# Patient Record
Sex: Male | Born: 1965 | Race: Black or African American | Hispanic: No | Marital: Single | Smoking: Current every day smoker
Health system: Southern US, Community
[De-identification: ages and names within clinical notes are randomized; demographics above are authoritative.]

---

## 2007-12-08 ENCOUNTER — Emergency Department: Payer: Self-pay | Admitting: Unknown Physician Specialty

## 2007-12-10 ENCOUNTER — Emergency Department: Payer: Self-pay | Admitting: Internal Medicine

## 2014-02-03 ENCOUNTER — Emergency Department: Payer: Self-pay | Admitting: Emergency Medicine

## 2014-02-03 LAB — LIPASE, BLOOD: LIPASE: 114 U/L (ref 73–393)

## 2014-02-03 LAB — CBC WITH DIFFERENTIAL/PLATELET
Basophil #: 0.1 10*3/uL (ref 0.0–0.1)
Basophil %: 1.3 %
EOS ABS: 0.1 10*3/uL (ref 0.0–0.7)
EOS PCT: 2.3 %
HCT: 43.1 % (ref 40.0–52.0)
HGB: 13.7 g/dL (ref 13.0–18.0)
LYMPHS ABS: 1.8 10*3/uL (ref 1.0–3.6)
Lymphocyte %: 34.5 %
MCH: 27.2 pg (ref 26.0–34.0)
MCHC: 31.9 g/dL — ABNORMAL LOW (ref 32.0–36.0)
MCV: 85 fL (ref 80–100)
Monocyte #: 0.4 x10 3/mm (ref 0.2–1.0)
Monocyte %: 7.4 %
NEUTROS PCT: 54.5 %
Neutrophil #: 2.8 10*3/uL (ref 1.4–6.5)
Platelet: 205 10*3/uL (ref 150–440)
RBC: 5.05 10*6/uL (ref 4.40–5.90)
RDW: 14.8 % — ABNORMAL HIGH (ref 11.5–14.5)
WBC: 5.1 10*3/uL (ref 3.8–10.6)

## 2014-02-03 LAB — COMPREHENSIVE METABOLIC PANEL
ALBUMIN: 3.2 g/dL — AB (ref 3.4–5.0)
ALT: 16 U/L
AST: 19 U/L (ref 15–37)
Alkaline Phosphatase: 68 U/L
Anion Gap: 5 — ABNORMAL LOW (ref 7–16)
BUN: 6 mg/dL — ABNORMAL LOW (ref 7–18)
Bilirubin,Total: 0.3 mg/dL (ref 0.2–1.0)
CHLORIDE: 106 mmol/L (ref 98–107)
CO2: 31 mmol/L (ref 21–32)
CREATININE: 1.24 mg/dL (ref 0.60–1.30)
Calcium, Total: 8.5 mg/dL (ref 8.5–10.1)
EGFR (African American): >60
EGFR (Non-African Amer.): >60
Glucose: 101 mg/dL — ABNORMAL HIGH (ref 65–99)
OSMOLALITY: 281 (ref 275–301)
Potassium: 3.7 mmol/L (ref 3.5–5.1)
Sodium: 142 mmol/L (ref 136–145)
TOTAL PROTEIN: 7 g/dL (ref 6.4–8.2)

## 2014-02-04 LAB — URINALYSIS, COMPLETE
BACTERIA: NONE SEEN
Bilirubin,UR: NEGATIVE
Blood: NEGATIVE
GLUCOSE, UR: NEGATIVE mg/dL (ref 0–75)
Ketone: NEGATIVE
Leukocyte Esterase: NEGATIVE
Nitrite: NEGATIVE
Ph: 6 (ref 4.5–8.0)
Protein: NEGATIVE
RBC, UR: NONE SEEN /HPF (ref 0–5)
SPECIFIC GRAVITY: 1.012 (ref 1.003–1.030)
SQUAMOUS EPITHELIAL: NONE SEEN
WBC UR: NONE SEEN /HPF (ref 0–5)

## 2014-10-11 ENCOUNTER — Encounter: Payer: Self-pay | Admitting: Emergency Medicine

## 2014-10-11 ENCOUNTER — Emergency Department
Admission: EM | Admit: 2014-10-11 | Discharge: 2014-10-11 | Disposition: A | Discharge status: Home / Self Care | Payer: Self-pay | Attending: Student | Admitting: Student

## 2014-10-11 ENCOUNTER — Emergency Department: Payer: Self-pay

## 2014-10-11 DIAGNOSIS — M4686 Other specified inflammatory spondylopathies, lumbar region: Secondary | ICD-10-CM | POA: Insufficient documentation

## 2014-10-11 DIAGNOSIS — K4090 Unilateral inguinal hernia, without obstruction or gangrene, not specified as recurrent: Secondary | ICD-10-CM

## 2014-10-11 DIAGNOSIS — M19012 Primary osteoarthritis, left shoulder: Secondary | ICD-10-CM

## 2014-10-11 DIAGNOSIS — Z720 Tobacco use: Secondary | ICD-10-CM | POA: Insufficient documentation

## 2014-10-11 DIAGNOSIS — M47896 Other spondylosis, lumbar region: Secondary | ICD-10-CM

## 2014-10-11 MED ORDER — MELOXICAM 15 MG PO TABS: 15.0000 mg | ORAL_TABLET | Freq: Every day | ORAL | Status: AC

## 2014-10-11 NOTE — ED Notes (Signed)
Pt reports that he has had left shoulder, back and groin pain. Pt is able to ambulate without difficulty.

## 2014-10-11 NOTE — ED Provider Notes (Signed)
[facility] Emergency Department Provider Note  ____________________________________________  Time seen: Approximately 9:07 AM  I have reviewed the triage vital signs and the nursing notes.   HISTORY  Chief Complaint Shoulder Pain; Back Pain; and Groin Pain    HPI Robert Boone is a 49 y.o. male complaining of left shoulder low back and groin pain. Patient stated that the shoulder back is a chronic condition last more than 2 years. Patient's date recent onset approximately 2 weeks of left groin pain was  with swelling. Patient stated the groin pain increases with heavy lifting which is required by his jaw. Patient rates his left shoulder and back pain as a 5/10 rates his left groin pain as a 7/10. Patient denies any bladder or bowel dysfunction. Patient has been taking over-the-counter anti-inflammatory is with no relief.   History reviewed. No pertinent past medical history.  There are no active problems to display for this patient.   History reviewed. No pertinent past surgical history.  No current outpatient prescriptions on file.  Allergies Review of patient's allergies indicates no known allergies.  History reviewed. No pertinent family history.  Social History History  Substance Use Topics  . Smoking status: Current Every Day Smoker  . Smokeless tobacco: Not on file  . Alcohol Use: No    Review of Systems Constitutional: No fever/chills Eyes: No visual changes. ENT: No sore throat. Cardiovascular: Denies chest pain. Respiratory: Denies shortness of breath. Gastrointestinal: No abdominal pain.  No nausea, no vomiting.  No diarrhea.  No constipation. Genitourinary: Negative for dysuria.Left groin pain  Musculoskeletal:Left shoulder and lower back pain. Skin: Negative for rash. Neurological: Negative for headaches, focal weakness or numbness. 10-point ROS otherwise  negative.  ____________________________________________   PHYSICAL EXAM:  VITAL SIGNS: ED Triage Vitals  Enc Vitals Group     BP 10/11/14 0849 142/84 mmHg     Pulse Rate 10/11/14 0849 60     Resp 10/11/14 0849 18     Temp 10/11/14 0849 98.3 F (36.8 C)     Temp Source 10/11/14 0849 Oral     SpO2 10/11/14 0849 100 %     Weight 10/11/14 0849 165 lb (74.844 kg)     Height 10/11/14 0849 6\' 1"  (1.854 m)     Head Cir --      Peak Flow --      Pain Score 10/11/14 0849 7     Pain Loc --      Pain Edu? --      Excl. in GC? --     Constitutional: Alert and oriented. Well appearing and in no acute distress. Eyes: Conjunctivae are normal. PERRL. EOMI. Head: Atraumatic. Nose: No congestion/rhinnorhea. Mouth/Throat: Mucous membranes are moist.  Oropharynx non-erythematous. Neck: No stridor. No cervical spine tenderness to palpation Hematological/Lymphatic/Immunilogical: No cervical lymphadenopathy. Cardiovascular: Normal rate, regular rhythm. Grossly normal heart sounds.  Good peripheral circulation. Respiratory: Normal respiratory effort.  No retractions. Lungs CTAB. Gastrointestinal: Soft and nontender. No distention. No abdominal bruits. No CVA tenderness. Genitourinary:  **}Musculoskeletal: No lower extremity tenderness nor edema.  No joint effusions. Neurologic:  Normal speech and language. No gross focal neurologic deficits are appreciated. Speech is normal. No gait instability. Skin:  Skin is warm, dry and intact. No rash noted. Psychiatric: Mood and affect are normal. Speech and behavior are normal.  ____________________________________________   LABS (all labs ordered are listed, but only abnormal results are displayed)  Labs Reviewed - No data to display ____________________________________________  EKG  ____________________________________________  RADIOLOGY     iNo acute findings for left shoulder pain mild arthritic changes in the L-spine. I, Joni Reining,  personally viewed and evaluated these images as part of my medical decision making.   ____________________________________________   PROCEDURES  Procedure(s) performed: None  Critical Care performed: No  ____________________________________________   INITIAL IMPRESSION / ASSESSMENT AND PLAN / ED COURSE  Pertinent labs & imaging results that were available during my care of the patient were reviewed by me and considered in my medical decision making (see chart for details).  Mild degenerative changes of the left shoulder and low back. Patient has a mild left inguinal hernia which was easily reducible. Patient will be given a consult the surgical clinic for further evaluation and treatment. Patient given a prescription for morbid and a work restriction until evaluation by the surgical clinic. ____________________________________________   FINAL CLINICAL IMPRESSION(S) / ED DIAGNOSES  Final diagnoses:  Left inguinal hernia  Other osteoarthritis of spine, lumbar region  Primary osteoarthritis of left shoulder      Joni Reining, PA-C 10/11/14 1120  Gayla Doss, MD 10/11/14 1529

## 2015-07-09 IMAGING — CT CT ABD-PELV W/ CM
2 of 5 series · 16 of 46 positions shown, 18 images · IV contrast (agent unspecified)
Comparison: None.

CLINICAL DATA: Left lower quadrant pain starting [REDACTED].
Lifting injury.

EXAM:
CT ABDOMEN AND PELVIS WITH CONTRAST
TECHNIQUE: Multidetector CT imaging of the abdomen and pelvis was performed
using the standard protocol following bolus administration of
intravenous contrast.
CONTRAST:  100 mL [id number]

[Series 2: routine abd pel with · axial · 0.71mm/px · z∈[-662,-242]mm · 13 of 96 slices shown, 15 images]
[im 6/96  soft-tissue]
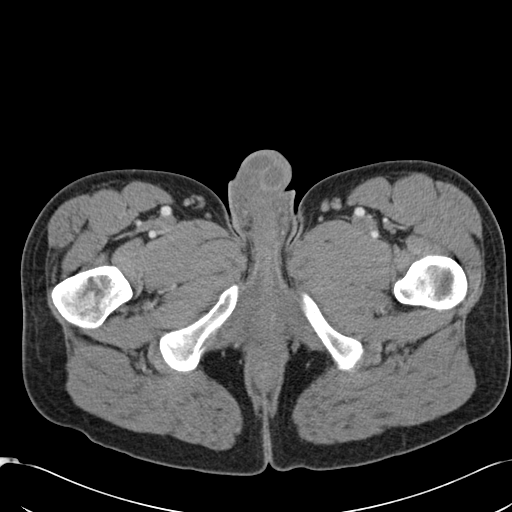
[im 6/96  bone]
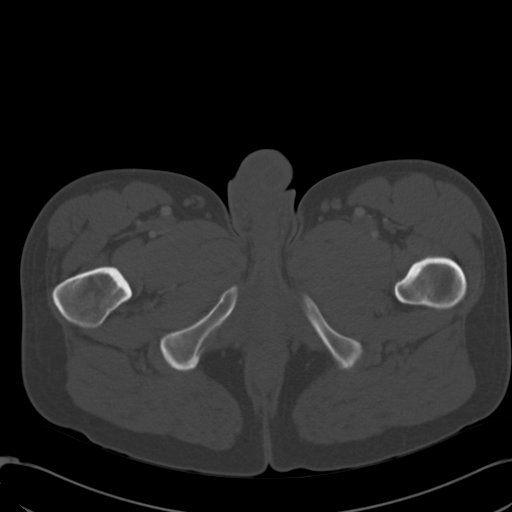
[im 11/96  soft-tissue]
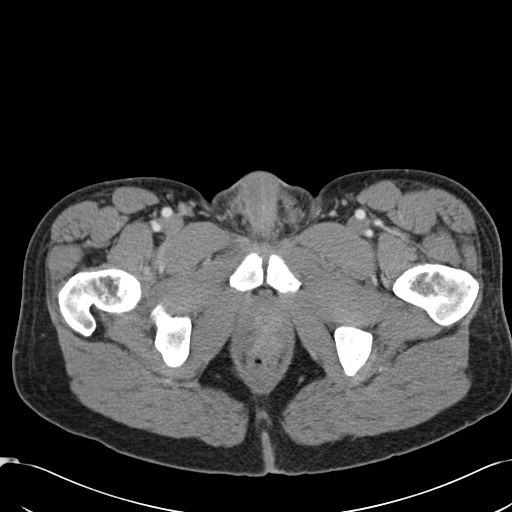
[im 22/96  soft-tissue]
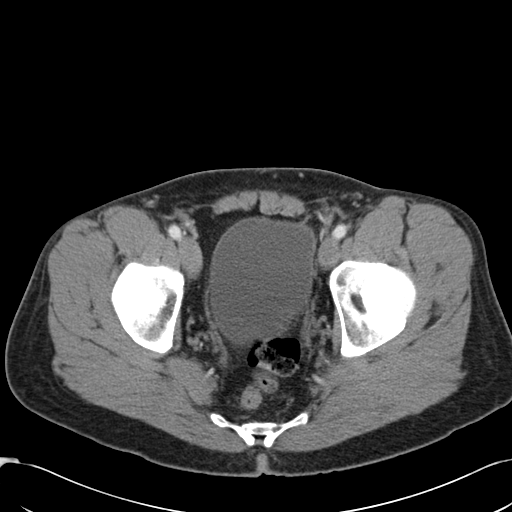
[im 27/96  soft-tissue]
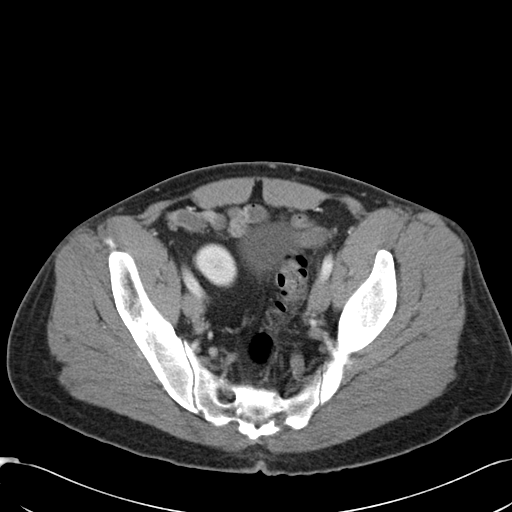
[im 32/96  soft-tissue]
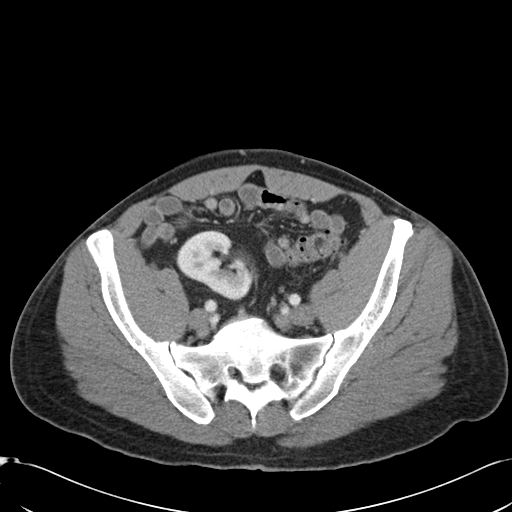
[im 43/96  soft-tissue]
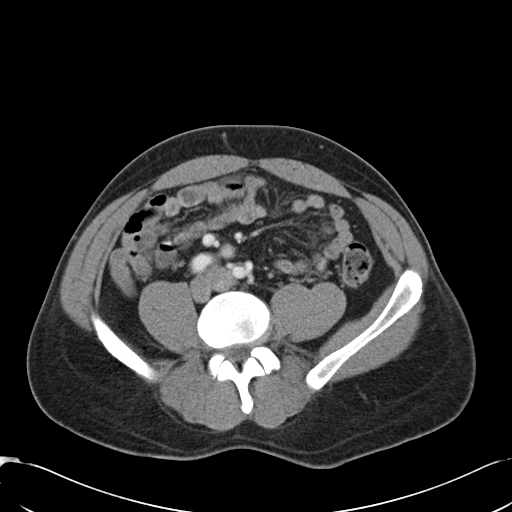
[im 48/96  soft-tissue]
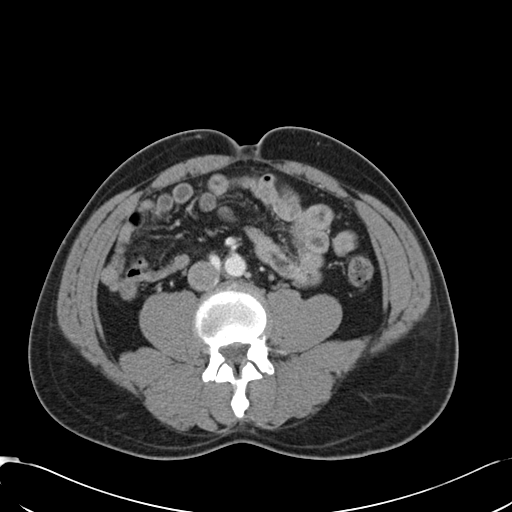
[im 53/96  soft-tissue]
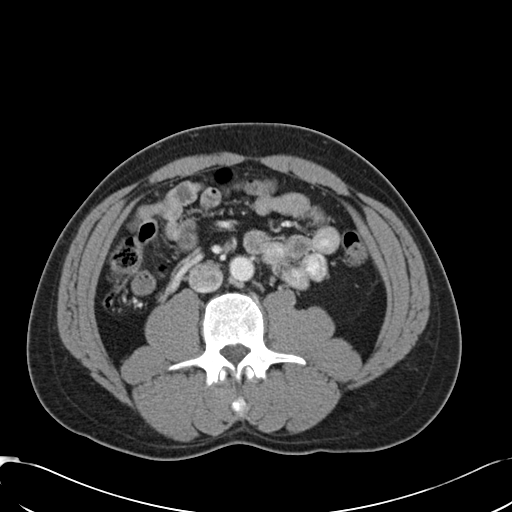
[im 64/96  soft-tissue]
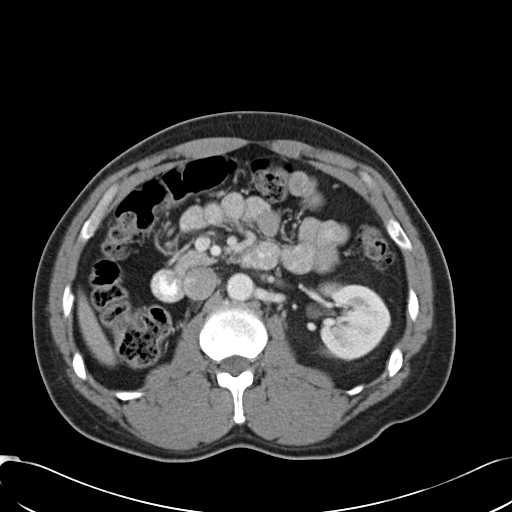
[im 64/96  bone]
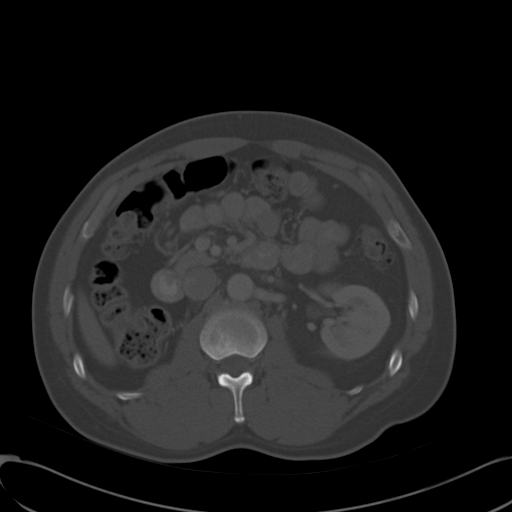
[im 69/96  soft-tissue]
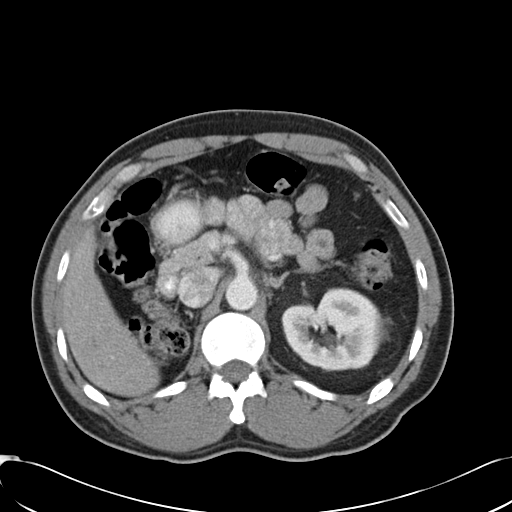
[im 74/96  soft-tissue]
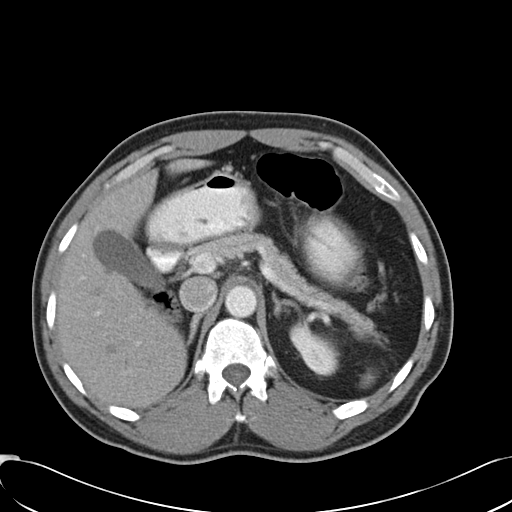
[im 85/96  soft-tissue]
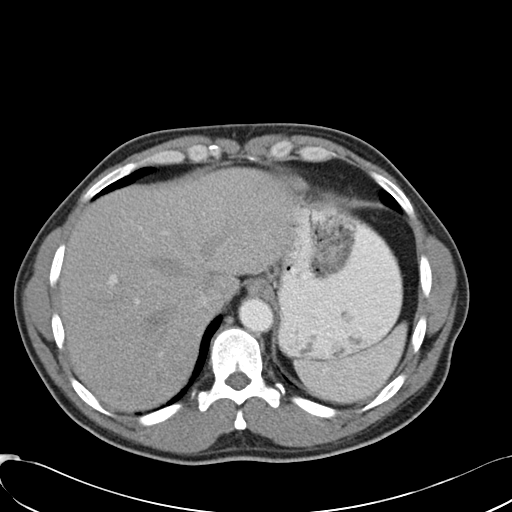
[im 90/96  soft-tissue]
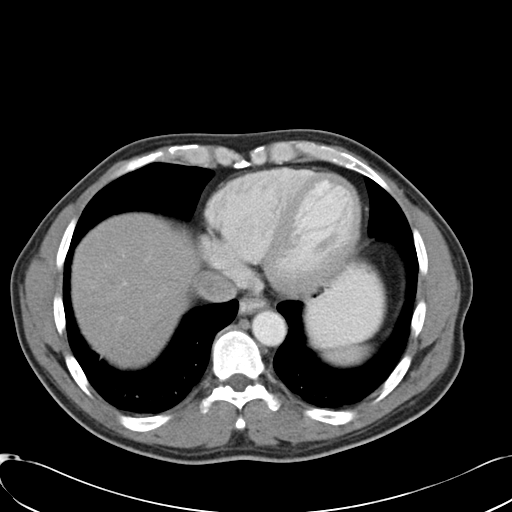

[Series 5: cor routine abd pel with · coronal · 0.64mm/px · 3 of 116 slices shown]
[im 39/116  soft-tissue]
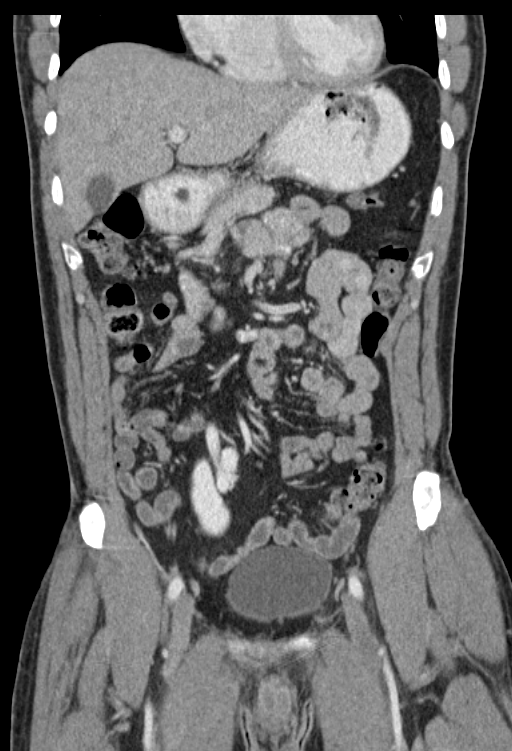
[im 52/116  soft-tissue]
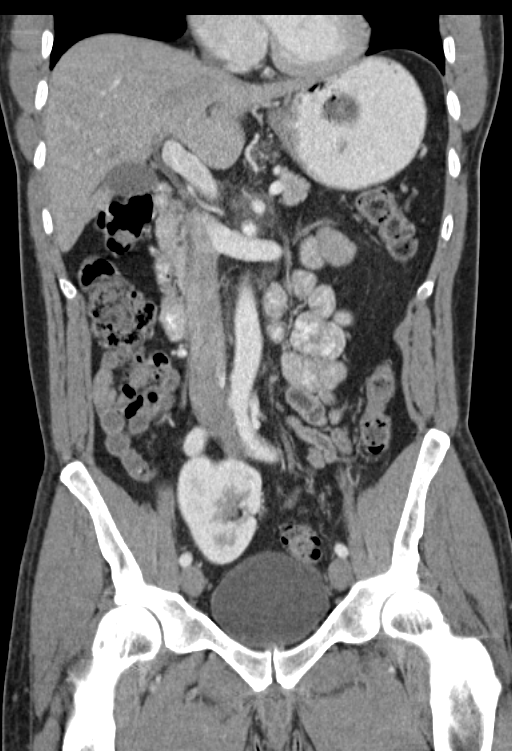
[im 64/116  soft-tissue]
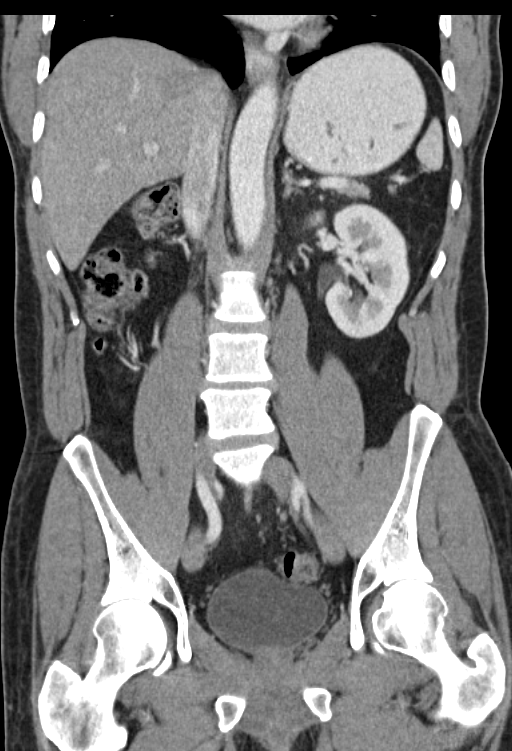

[16 of 46 positions shown; findings below may reference images not displayed]

FINDINGS: Dependent atelectasis in the lung bases. Residual contrast material
in the lower esophagus may indicate reflux or dysmotility.

The liver, spleen, gallbladder, pancreas, adrenal glands, abdominal
aorta, inferior vena cava, and retroperitoneal lymph nodes are
unremarkable. Pelvic location of the right kidney is otherwise
unremarkable. Normal appearance of the left kidney. Stomach is
filled with contrast material and food. Small bowel and colon are
not abnormally distended. No free air or free fluid in the abdomen.
Abdominal wall musculature appears intact.

Pelvis: Enlarged prostate gland. Bladder wall is not thickened. No
free or loculated pelvic fluid collections. No pelvic mass or
lymphadenopathy. Appendix is normal. No evidence of diverticulitis.
Normal alignment of the lumbar spine. No vertebral compression
fractures. No destructive bone lesions.
IMPRESSION: No acute process demonstrated in the abdomen or pelvis. Pelvic right
kidney.

## 2016-03-15 IMAGING — CR DG SHOULDER 2+V*L*
1 series · 3 of 3 positions shown · non-contrast
Comparison: None.

CLINICAL DATA: Left shoulder pain, no known injury, initial
encounter

EXAM:
LEFT SHOULDER - 2+ VIEW

[Series 1: dg shoulder left · 0.14mm/px · 3 of 3 slices shown]
[im 1/3]
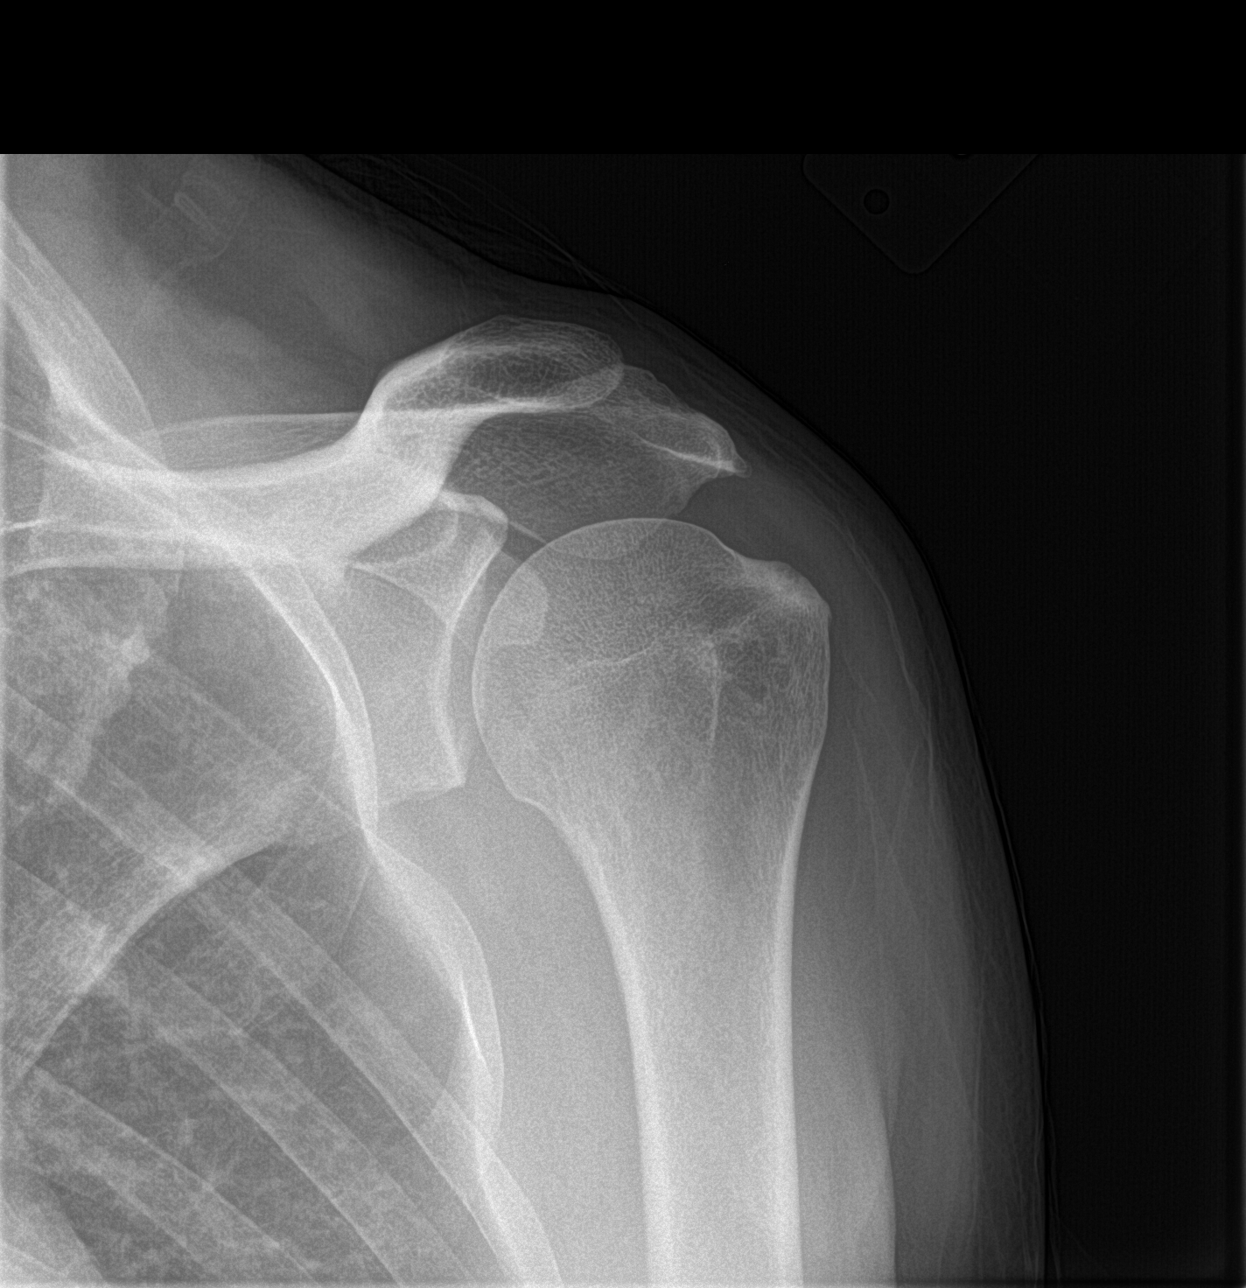
[im 2/3]
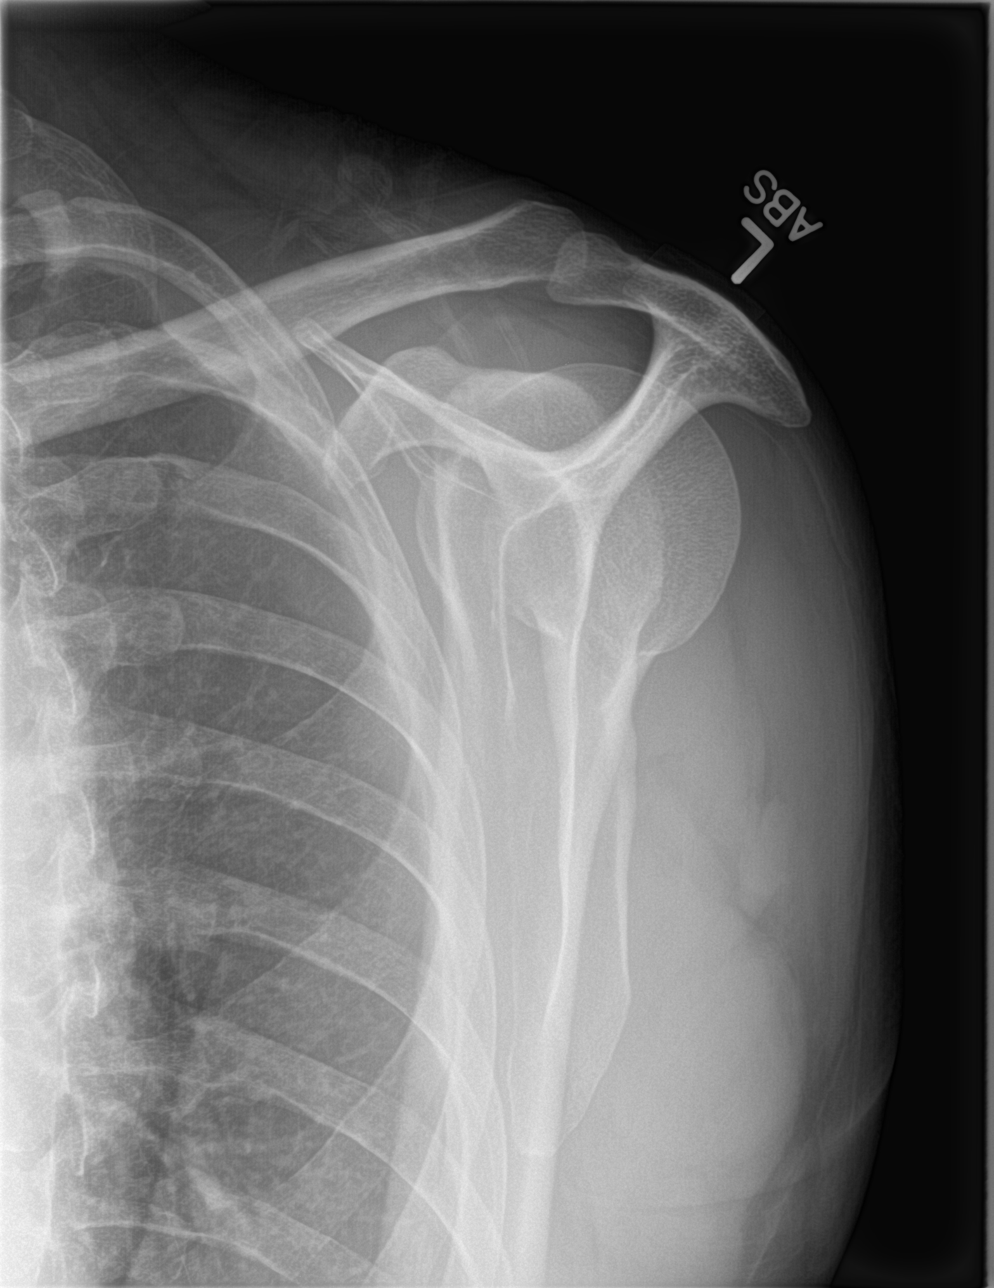
[im 3/3]
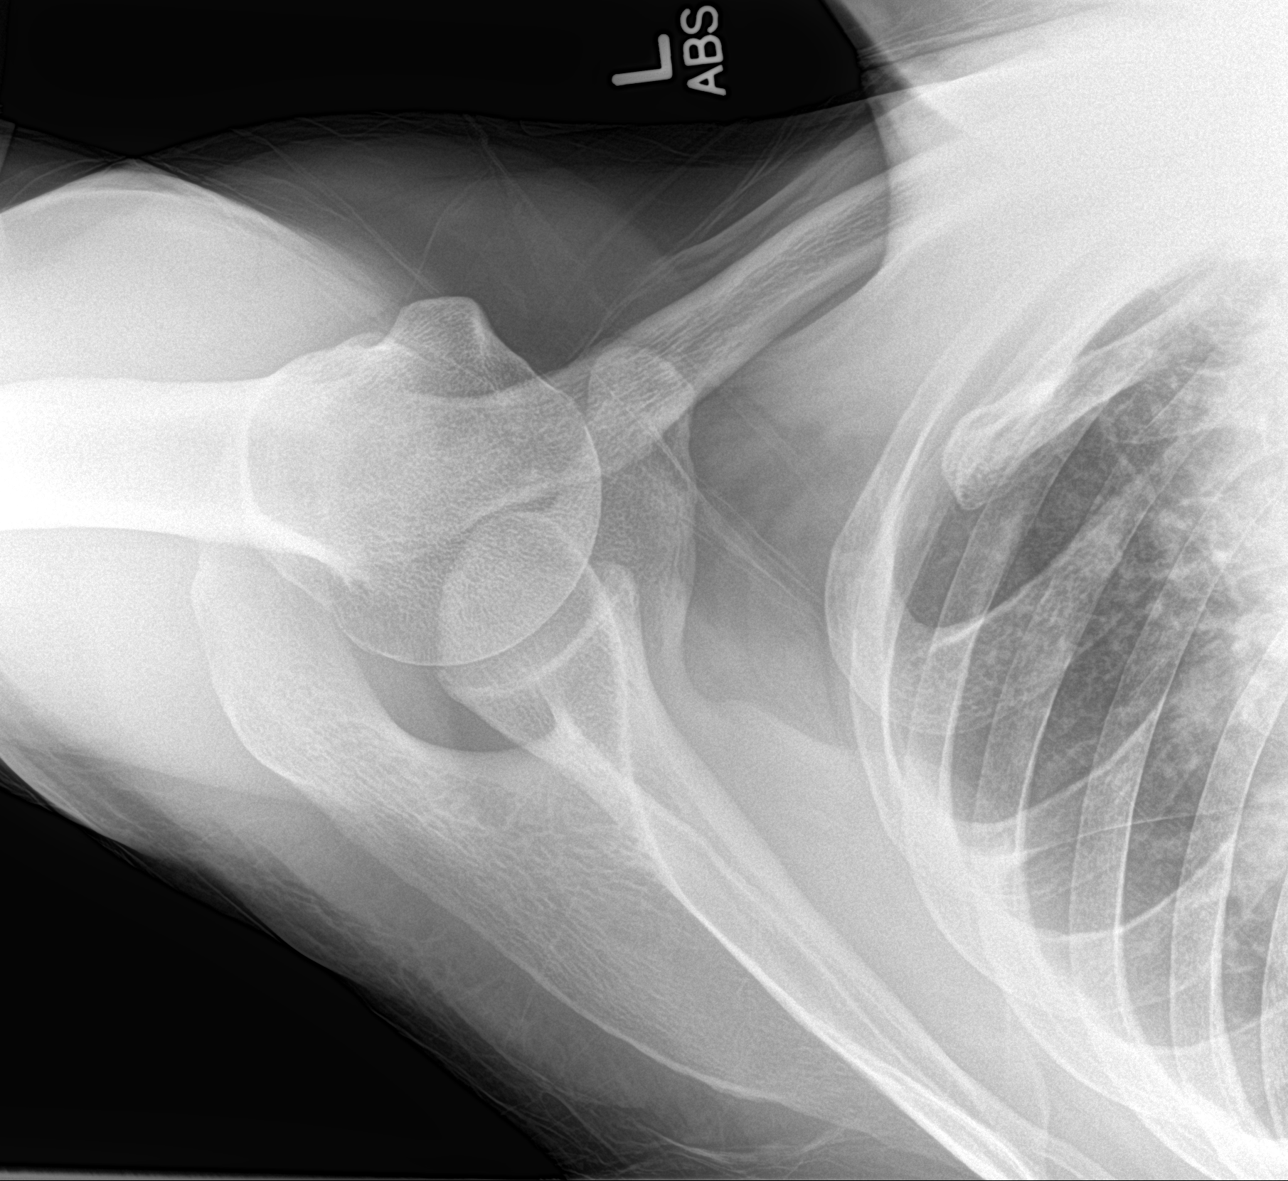

[3 of 3 positions shown; findings below may reference images not displayed]

FINDINGS: There is no evidence of fracture or dislocation. There is no
evidence of arthropathy or other focal bone abnormality. Soft
tissues are unremarkable.
IMPRESSION: No acute abnormality noted.

## 2021-11-12 ENCOUNTER — Encounter: Payer: Self-pay | Admitting: Emergency Medicine

## 2021-11-12 ENCOUNTER — Other Ambulatory Visit: Payer: Self-pay

## 2021-11-12 ENCOUNTER — Emergency Department: Payer: Self-pay

## 2021-11-12 ENCOUNTER — Emergency Department
Admission: EM | Admit: 2021-11-12 | Discharge: 2021-11-12 | Disposition: A | Discharge status: Home / Self Care | Payer: Self-pay | Attending: Emergency Medicine | Admitting: Emergency Medicine

## 2021-11-12 DIAGNOSIS — M5431 Sciatica, right side: Secondary | ICD-10-CM | POA: Insufficient documentation

## 2021-11-12 MED ORDER — ACETAMINOPHEN 500 MG PO TABS
1000.0000 mg | ORAL_TABLET | Freq: Once | ORAL | Status: AC
  Administered 2021-11-12: 1000 mg via ORAL
  Filled 2021-11-12: qty 2

## 2021-11-12 MED ORDER — KETOROLAC TROMETHAMINE 30 MG/ML IJ SOLN
30.0000 mg | Freq: Once | INTRAMUSCULAR | Status: AC
  Administered 2021-11-12: 30 mg via INTRAMUSCULAR
  Filled 2021-11-12: qty 1

## 2021-11-12 MED ORDER — KETOROLAC TROMETHAMINE 30 MG/ML IJ SOLN: 30.0000 mg | Freq: Once | INTRAMUSCULAR | Status: DC

## 2021-11-12 MED ORDER — LIDOCAINE 5 % EX PTCH: 1.0000 | MEDICATED_PATCH | Freq: Two times a day (BID) | CUTANEOUS | 0 refills | Status: AC

## 2021-11-12 MED ORDER — PREDNISONE 10 MG (21) PO TBPK: ORAL_TABLET | ORAL | 0 refills | Status: AC

## 2021-11-12 MED ORDER — LIDOCAINE 5 % EX PTCH
1.0000 | MEDICATED_PATCH | CUTANEOUS | Status: DC
  Administered 2021-11-12: 1 via TRANSDERMAL
  Filled 2021-11-12: qty 1

## 2021-11-12 NOTE — Discharge Instructions (Addendum)
You can take Tylenol 1 g every 8 hours and use ibuprofen 400 every 6-8 hours with food and use the steroids and pain patches and follow-up with orthopedic surgery.  If you develop numbness in the leg, redness, fevers, urinating or pooping on yourself then please return to the ER for repeat evaluation

## 2021-11-12 NOTE — ED Triage Notes (Signed)
Pt via POV from home. Pt c/o R hip pain. States that he does a lot of walking. It has been bothering him the past 3 weeks but got worse today. Pt is A&Ox4 and NAD. Ambulatory to triage.

## 2021-11-12 NOTE — ED Provider Notes (Addendum)
[facility] Provider Note    Event Date/Time   First MD Initiated Contact with Patient 11/12/21 1345     (approximate)   History   Hip Pain   HPI  Robert Boone is a 56 y.o. male  \who comes in with right hip pain.  Patient reports has been doing a lot of walking recently has been bothered and wrosening him for the past 3 weeks but got worse today.  Patient was ambulatory in triage.  According to patient he is got pain in his right SI joint that radiates down the back of his leg.  He denies any numbness.  He still able to ambulate.  Denies any urinating or defecating on himself, numbness, pain in his genital area.  He reports that he came in today just due to the worsening pain.  He does report taking some Advil.  He reports that he is otherwise healthy although has not seen a doctor in a few years.  Denies any history of diabetes or symptoms of diabetes.  He denies any falls onto his back.  Denies any IV drug use, fevers. Denies urinary symptoms. Does report doing a lot of work outside.    Physical Exam   Triage Vital Signs: ED Triage Vitals  Enc Vitals Group     BP 11/12/21 1348 131/80     Pulse Rate 11/12/21 1348 61     Resp 11/12/21 1348 20     Temp 11/12/21 1348 98.3 F (36.8 C)     Temp Source 11/12/21 1348 Oral     SpO2 11/12/21 1348 98 %     Weight 11/12/21 1334 160 lb (72.6 kg)     Height 11/12/21 1334 6\' 1"  (1.854 m)     Head Circumference --      Peak Flow --      Pain Score 11/12/21 1334 10     Pain Loc --      Pain Edu? --      Excl. in GC? --     Most recent vital signs: Vitals:   11/12/21 1348  BP: 131/80  Pulse: 61  Resp: 20  Temp: 98.3 F (36.8 C)  SpO2: 98%     General: Awake, no distress.  CV:  Good peripheral perfusion.  Resp:  Normal effort.  Abd:  No distention.  Other:  Equal strength in legs although does report pain in the right SI joint.  He is got tenderness when I push on the right SI joint.  He is  got no redness or warmth noted of the joint.  He is got good distal pulse.  Able to flex and extend the ankle.  He is got no knee pain.  He is got no rectal numbness.  He has no CTL spine tenderness on palpation.  No calf tenderness.    ED Results / Procedures / Treatments  RADIOLOGY I have reviewed the xray personally and interpreted and do not see any evidence of fracture  PROCEDURES:  Critical Care performed: No  Procedures   MEDICATIONS ORDERED IN ED: Medications - No data to display   IMPRESSION / MDM / ASSESSMENT AND PLAN / ED COURSE  I reviewed the triage vital signs and the nursing notes.   Patient's presentation is most consistent with acute, uncomplicated illness.   Differential includes fracture, sciatica.  No evidence of cord compression based upon examination.  No evidence of septic joint.  No evidence of arterial occlusion.  No evidence of  DVT.  X-ray ordered from triage.  Patient given a lidocaine patch, Tylenol, Toradol.  X-rays reassuring on repeat evaluation patient is able to ambulate but does report some continued pain in the right SI area.  He he does report that the pain is better though.  We discussed treatment with steroids, lidocaine patches, Tylenol, ibuprofen and returning for evidence of cord compression.  We discussed the signs of this.  He expressed understanding and can follow-up with orthopedics as well    FINAL CLINICAL IMPRESSION(S) / ED DIAGNOSES   Final diagnoses:  Sciatica of right side     Rx / DC Orders   ED Discharge Orders          Ordered    lidocaine (LIDODERM) 5 %  Every 12 hours        11/12/21 1528    predniSONE (STERAPRED UNI-PAK 21 TAB) 10 MG (21) TBPK tablet        11/12/21 1528             Note:  This document was prepared using Dragon voice recognition software and may include unintentional dictation errors.   Concha Se, MD 11/12/21 1530    Concha Se, MD 11/12/21 [phone]
